# Patient Record
Sex: Male | Born: 1935 | Race: White | Hispanic: No | Smoking: Current every day smoker
Health system: Southern US, Community
[De-identification: ages and names within clinical notes are randomized; demographics above are authoritative.]

## PROBLEM LIST (undated history)

## (undated) DIAGNOSIS — J449 Chronic obstructive pulmonary disease, unspecified: Secondary | ICD-10-CM

## (undated) DIAGNOSIS — F32A Depression, unspecified: Secondary | ICD-10-CM

## (undated) DIAGNOSIS — G4700 Insomnia, unspecified: Secondary | ICD-10-CM

## (undated) DIAGNOSIS — E1140 Type 2 diabetes mellitus with diabetic neuropathy, unspecified: Secondary | ICD-10-CM

## (undated) DIAGNOSIS — R609 Edema, unspecified: Secondary | ICD-10-CM

## (undated) DIAGNOSIS — F329 Major depressive disorder, single episode, unspecified: Secondary | ICD-10-CM

## (undated) DIAGNOSIS — E785 Hyperlipidemia, unspecified: Secondary | ICD-10-CM

## (undated) DIAGNOSIS — I10 Essential (primary) hypertension: Secondary | ICD-10-CM

## (undated) HISTORY — PX: ANKLE SURGERY: SHX546

## (undated) HISTORY — DX: Hyperlipidemia, unspecified: E78.5

## (undated) HISTORY — PX: VASECTOMY: SHX75

## (undated) HISTORY — DX: Major depressive disorder, single episode, unspecified: F32.9

## (undated) HISTORY — DX: Edema, unspecified: R60.9

## (undated) HISTORY — DX: Chronic obstructive pulmonary disease, unspecified: J44.9

## (undated) HISTORY — DX: Depression, unspecified: F32.A

## (undated) HISTORY — DX: Insomnia, unspecified: G47.00

## (undated) HISTORY — DX: Type 2 diabetes mellitus with diabetic neuropathy, unspecified: E11.40

---

## 2002-10-13 ENCOUNTER — Encounter: Payer: Self-pay | Admitting: Family Medicine

## 2002-10-13 ENCOUNTER — Encounter: Admission: RE | Admit: 2002-10-13 | Discharge: 2002-10-13 | Payer: Self-pay | Admitting: Family Medicine

## 2003-06-07 ENCOUNTER — Encounter: Admission: RE | Admit: 2003-06-07 | Discharge: 2003-06-07 | Payer: Self-pay | Admitting: Family Medicine

## 2003-06-14 ENCOUNTER — Encounter: Admission: RE | Admit: 2003-06-14 | Discharge: 2003-06-14 | Payer: Self-pay | Admitting: Family Medicine

## 2006-04-12 ENCOUNTER — Inpatient Hospital Stay (HOSPITAL_COMMUNITY): Admission: EM | Admit: 2006-04-12 | Discharge: 2006-04-25 | Payer: Self-pay | Admitting: Emergency Medicine

## 2006-04-16 ENCOUNTER — Encounter (INDEPENDENT_AMBULATORY_CARE_PROVIDER_SITE_OTHER): Payer: Self-pay | Admitting: Interventional Cardiology

## 2006-11-13 ENCOUNTER — Inpatient Hospital Stay (HOSPITAL_COMMUNITY): Admission: EM | Admit: 2006-11-13 | Discharge: 2006-11-16 | Payer: Self-pay | Admitting: Emergency Medicine

## 2006-11-26 ENCOUNTER — Emergency Department (HOSPITAL_COMMUNITY): Admission: EM | Admit: 2006-11-26 | Discharge: 2006-11-26 | Payer: Self-pay | Admitting: Emergency Medicine

## 2007-09-08 ENCOUNTER — Emergency Department (HOSPITAL_COMMUNITY): Admission: EM | Admit: 2007-09-08 | Discharge: 2007-09-08 | Payer: Self-pay | Admitting: Emergency Medicine

## 2008-03-03 IMAGING — CR DG CHEST 1V PORT
2 series · 2 of 2 positions shown · non-contrast
Comparison: 04/13/06.

CLINICAL DATA: Altered mental status.  Intoxicated. Fall.
 PORTABLE CHEST - 1 VIEW:

[view not recorded (1 of 2)]
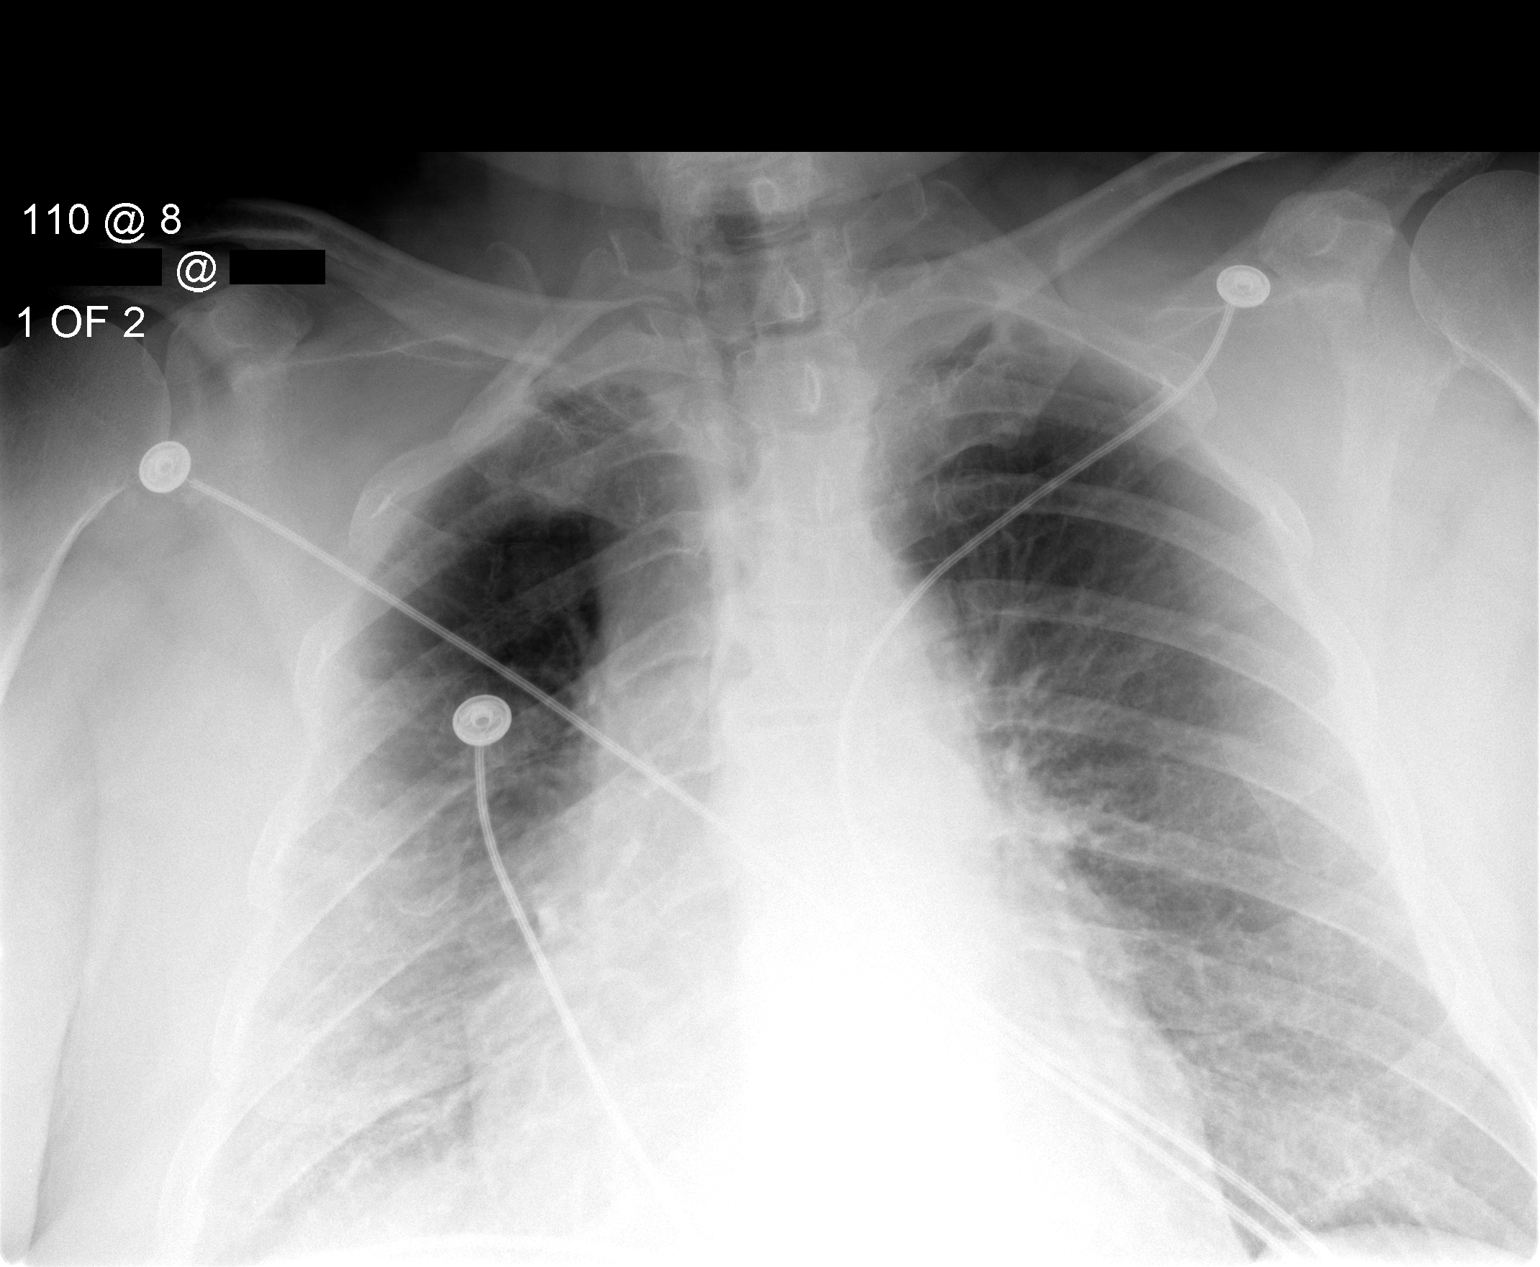

[view not recorded (2 of 2)]
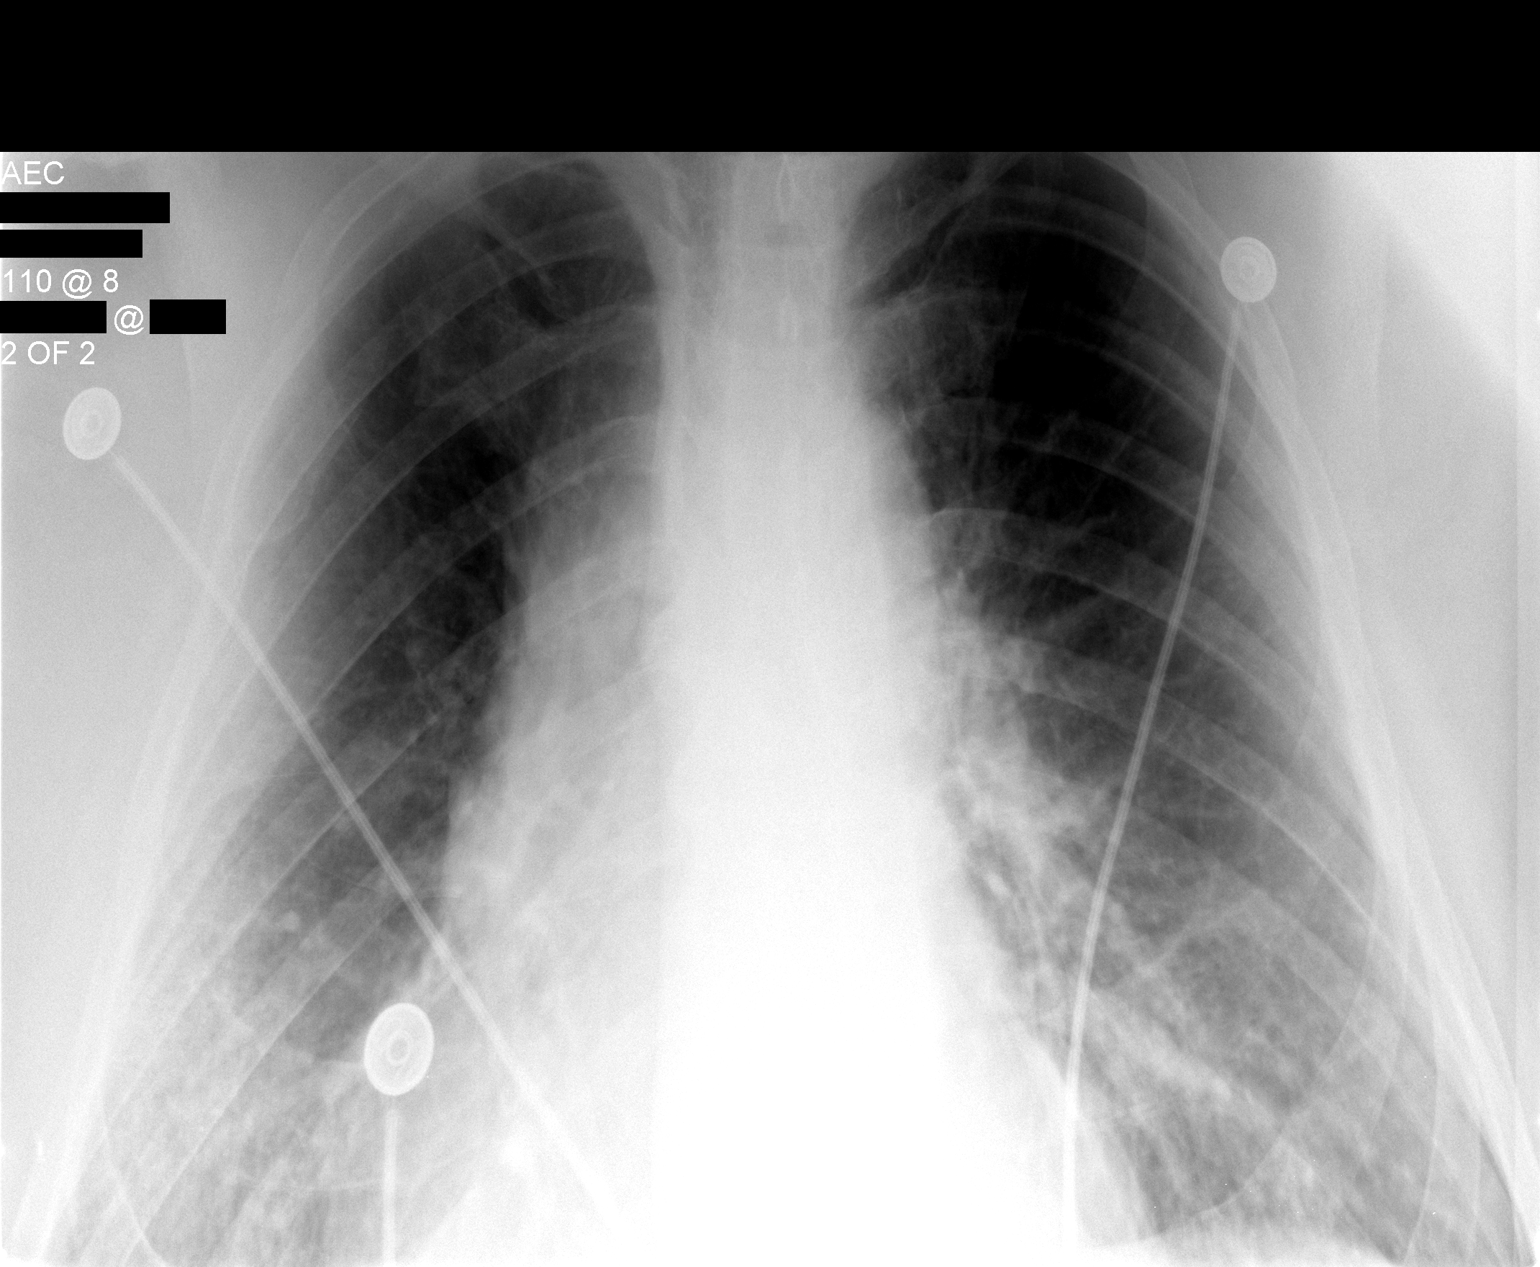

[2 of 2 positions shown; findings below may reference images not displayed]

FINDINGS: Both lungs are grossly clear. Heart size is within normal limits allowing for portable technique and patient positioning.
IMPRESSION: No acute findings.

## 2008-12-05 ENCOUNTER — Encounter: Payer: Self-pay | Admitting: Cardiology

## 2008-12-06 ENCOUNTER — Encounter: Payer: Self-pay | Admitting: Cardiology

## 2008-12-27 ENCOUNTER — Encounter: Payer: Self-pay | Admitting: Cardiology

## 2009-01-09 ENCOUNTER — Ambulatory Visit: Payer: Self-pay | Admitting: Cardiology

## 2009-01-09 DIAGNOSIS — G609 Hereditary and idiopathic neuropathy, unspecified: Secondary | ICD-10-CM | POA: Insufficient documentation

## 2009-01-09 DIAGNOSIS — F17200 Nicotine dependence, unspecified, uncomplicated: Secondary | ICD-10-CM

## 2009-01-09 DIAGNOSIS — E119 Type 2 diabetes mellitus without complications: Secondary | ICD-10-CM

## 2009-01-09 DIAGNOSIS — I10 Essential (primary) hypertension: Secondary | ICD-10-CM | POA: Insufficient documentation

## 2009-01-09 DIAGNOSIS — I499 Cardiac arrhythmia, unspecified: Secondary | ICD-10-CM | POA: Insufficient documentation

## 2009-01-09 DIAGNOSIS — R609 Edema, unspecified: Secondary | ICD-10-CM | POA: Insufficient documentation

## 2009-01-09 DIAGNOSIS — I4891 Unspecified atrial fibrillation: Secondary | ICD-10-CM

## 2009-01-09 DIAGNOSIS — E785 Hyperlipidemia, unspecified: Secondary | ICD-10-CM

## 2009-01-13 ENCOUNTER — Encounter: Payer: Self-pay | Admitting: Physician Assistant

## 2009-01-17 ENCOUNTER — Encounter: Payer: Self-pay | Admitting: Cardiology

## 2009-03-15 ENCOUNTER — Ambulatory Visit: Payer: Self-pay | Admitting: Cardiology

## 2009-03-15 DIAGNOSIS — R05 Cough: Secondary | ICD-10-CM

## 2009-03-23 ENCOUNTER — Telehealth: Payer: Self-pay | Admitting: Cardiology

## 2010-03-27 NOTE — Progress Notes (Signed)
Summary: PRADAXA & ECHO F/U  ---- Converted from flag ---- ---- 03/15/2009 5:11 PM, Rollene Rotunda, MD, [facility] wrote: Please call him in one week and see if he called about the Pradaxa.  When you talk to him, tell him that you never got a copy of any old echoes and that he needs an echocardiogram.  (I don't see that we have one unless I am just missing it.) ------------------------------  Phone Note Outgoing Call   Call placed by: Carlye Grippe,  March 23, 2009 8:43 AM Call placed to: Patient Summary of Call: Nurse called and left message on voicemail to check on status of the above. Spoke with patient and he said that he did speak with [employer] and they will pay for pradaxa. Patient also informed that he needed an echo and he said that he has to have some repairs on his vessels in his eyes per Dr. Haynes(optomologist) on Monday, and after all this is done he would call us back on getting the echo and start of pradaxa.  Initial call taken by: Carlye Grippe,  March 23, 2009 8:44 AM

## 2010-03-27 NOTE — Assessment & Plan Note (Signed)
Summary: 1 MO FU -SRS   Visit Type:  Follow-up Primary Provider:  Ernestine Conrad, MD  CC:  Atrial Fib.  History of Present Illness: The patient presents for follow up of the above.  He has been feeling poorly over several days with URI symptoms of cough congestion and watery eyes.  He was treated with a Zpack but is no longer better.  He is quite upset about how he feels.  He has no fever today but says that 98 degrees is "high for me".   When I last saw him, as a new patient, we talked about his atrial fibrillation.  This was apparently a chronic diagnosis. However, because of alcohol use and some issues with compliance he was apparently not taking Coumadin. I did have him wear a Holter monitor which demonstrated persistent atrial fibrillation. On this he did have some 2 second pauses but nothing longer. He would have bursts of a heart rate going as high as 190 but the average was about 98.  (Of note the patient reports that no one ever notified him of these results. We have documented in the records that he was called and verbalized understanding of these results.)  He did consent to starting Coumadin and he is here to talk about this today. Unfortunately it is clear talking to him that he is not willing to pay the co-pay to have his INRs followed. He understands clearly the risk of stroke.  He says he has not felt well enough in recent weeks to "smoke more than a few cigarettes a day". It's not clear to me how much he is drinking but he says very little as he just doesn't have an appetite for it. He is not describing any new chest pressure, neck or arm discomfort. He's not had any presyncope or syncope. His lower extremity swelling seems to be baseline.  Current Medications (verified): 1)  Protonix 40 Mg Tbec (Pantoprazole Sodium) .... Take 1 Tablet By Mouth Once A Day 2)  Januvia 100 Mg Tabs (Sitagliptin Phosphate) .... Take 1 Tablet By Mouth Once A Day 3)  Ventolin Hfa 108 (90 Base) Mcg/act Aers  (Albuterol Sulfate) .... As Needed 4)  Advair Diskus 250-50 Mcg/dose Aepb (Fluticasone-Salmeterol) .... Inhale 1 Puff Two Times A Day 5)  Lipitor 20 Mg Tabs (Atorvastatin Calcium) .... Take One Tablet By Mouth At Bedtime 6)  Zolpidem Tartrate 10 Mg Tabs (Zolpidem Tartrate) .... Take 1 Tab By Mouth At Bedtime 7)  Tricor 145 Mg Tabs (Fenofibrate) .... Take 1 Tablet By Mouth Once A Day 8)  Diltiazem Hcl Er Beads 240 Mg Xr24h-Cap (Diltiazem Hcl Er Beads) .... Take One Capsule By Mouth Daily 9)  Aspirin 81 Mg Tbec (Aspirin) .... Take One Tablet By Mouth Two Times A Day 10)  Furosemide 40 Mg Tabs (Furosemide) .... Take One Tablet By Mouth Two Times A Day As Needed Swelling 11)  Potassium Chloride Crys Cr 20 Meq Cr-Tabs (Potassium Chloride Crys Cr) .... Take 1 Tablet By Mouth As Needed With Lasix 12)  Flonase 50 Mcg/act Susp (Fluticasone Propionate) .... One Spray in Each Nostril Once Daily 13)  Humalog Pen 100 Unit/ml Soln (Insulin Lispro (Human)) .... Inject 10 Units Subcutaneously With Meals. 14)  Lantus 100 Unit/ml Soln (Insulin Glargine) .... Inject 12 Units Subcutaneously At Bedtime 15)  Coq10 200 Mg Caps (Coenzyme Q10) .... Take 1 Capsule By Mouth Once A Day or Every Other Day 16)  Centrum Silver  Tabs (Multiple Vitamins-Minerals) .... Take 1 Tablet  By Mouth Once A Day 17)  Megared Omega-3 Krill Oil .... Take 1 Tablet By Mouth Every Other Day  Allergies (verified): 1)  Neurontin  Comments:  Nurse/Medical Assistant: The patient's medications were reviewed with the patient and were updated in the Medication List. Pt brought a list of medications to office visit. Cyril Loosen, RN, BSN (March 15, 2009 4:10 PM)  Past History:  Past Medical History: Last updated: 01/09/2009 Diabetes Mellitus II since 2005 Edema Insomnia Hyperlipidemia HTN Depression Diabetic Neuropathy COPD  Review of Systems       As stated in the HPI and negative for all other systems.   Vital  Signs:  Patient profile:   75 year old male Height:      76 inches Weight:      280.75 pounds Temp:     98.3 degrees F oral Pulse rate:   84 / minute BP sitting:   122 / 83  (left arm) Cuff size:   regular  Vitals Entered By: Cyril Loosen, RN, BSN (March 15, 2009 4:01 PM) CC: Atrial Fib Comments Pt states he is sick and has some kind of virus. He states he saw primary MD on Monday was was told he was dehydrated. He was given Z-pack. He states one minute he's freezing and one he's burning up. He states burning up right now. No fever.    Physical Exam  General:  Well developed, well nourished, in no acute distress. Head:  normocephalic and atraumatic Eyes:  PERRLA/EOM intact; conjunctiva and lids normal. Mouth:  Upper dentures, lower partial, gums and palate normal. Oral mucosa normal. Neck:  Neck supple, no JVD. No masses, thyromegaly or abnormal cervical nodes. Chest Wall:  no deformities or breast masses noted Lungs:  Clear bilaterally to auscultation and percussion. Abdomen:  Bowel sounds positive; abdomen soft and non-tender without masses, organomegaly, or hernias noted. No hepatosplenomegaly, morbidly obese with umbilical hernia Msk:  Back normal, normal gait. Muscle strength and tone normal. Extremities:  moderate bilateral lower extremity edema right greater than left with chronic venous stasis changes Neurologic:  Alert and oriented x 3. Skin:  Intact without lesions or rashes. Cervical Nodes:  no significant adenopathy Psych:  agitated.     Detailed Cardiovascular Exam  Neck    Carotids: Carotids full and equal bilaterally without bruits.      Neck Veins: Normal, no JVD.    Heart    Inspection: no deformities or lifts noted.      Palpation: normal PMI with no thrills palpable.      Auscultation: irregular rate and rhythm, S1, S2 without murmurs, rubs, gallops, or clicks.    Vascular    Abdominal Aorta: no palpable masses, pulsations, or audible bruits.       Femoral Pulses: normal femoral pulses bilaterally.      Pedal Pulses: diminished right dorsalis pedis pulse, diminished right posterior tibial pulse, diminished left dorsalis pedis pulse, and diminished left posterior tibial pulse.      Radial Pulses: normal radial pulses bilaterally.      Peripheral Circulation: mild chronic venous stasis changes   EKG  Procedure date:  03/15/2009  Findings:      atrial fibrillation, rate 94, left axis deviation, no acute ST-T wave changes.  Impression & Recommendations:  Problem # 1:  ATRIAL FIBRILLATION (ICD-427.31) We have had long discussions about this. At this point he will not consent to Coumadin though he knows the risk of stroke without therapeutic anticoagulation.   I gave him  information on Pradaxa and he has the ability to call his insurance company to see if this drug would be covered at a rate he can afford. If not he will call the company to see if he would qualify for any medication assistance programs. We will then check back with him to see if he has done any of this and the results. We will then discuss options. I discussed with him taking a higher dose aspirin but he does not want to do this because of previous ulcers apparently. Orders: EKG w/ Interpretation (93000)  Problem # 2:  COUGH (ICD-786.2) The patient has had a recent cough and treatment with antibiotics. However, he is not improved. I spoke with his primary care physician today. They have agreed to see the patient tomorrow morning at 8 AM to consider further therapies. At this point I do not think that he is volume overloaded but a chest x-ray would be reasonable. A BNP if not drawn previously would be helpful. I did tell the patient that if he feels ill and off I could help him get to the emergency room but he did not want to do this.  Problem # 3:  EDEMA (ICD-782.3) I was unable to obtain results of any previous echocardiogram. When we speak with him in one week I will ask  him to get an echocardiogram if he is feeling well enough to comply with this exam. For now we will continue the meds as listed.  Problem # 4:  ENCOUNTER FOR LONG-TERM USE OF OTHER MEDICATIONS (ICD-V58.69) This patient has multiple medical problems. He was somewhat angry today about various issues such as bills that he has had a in doctor's offices. He was not pleasant with my staff or with me. We discussed this. I am afraid that the noncompliance might be a significant issue with this gentleman as most options I have offered him and met with resistance for one reason or another. We will continue to try to work through this.  Patient Instructions: 1)  You have an appointment on 03/16/09 @8 :00am with Dr. Loney Hering. 2)  You have been given information on pradaxa. This is the medication [place] can take inplace of coumadin. Please check with your insurance  company to see if they cover this drug. 3)  No follow up appointment needed. 4)  Your physician recommends that you continue on your current medications as directed. Please refer to the Current Medication list given to you today.

## 2010-07-10 NOTE — H&P (Signed)
NAME:  Luis Mccall, SELF NO.:  [id number]   MEDICAL RECORD NO.:  [id number]          PATIENT TYPE:  EMS   LOCATION:  ED                           FAC[facility]   PHYSICIAN:  Herbie Saxon, MDDATE OF BIRTH:  13-Nov-1935   DATE OF ADMISSION:  11/13/2006  DATE OF DISCHARGE:                              HISTORY & PHYSICAL   PRIMARY CARE PHYSICIAN:  Not known.   PRESENTING COMPLAINT:  Confusion, fall of one day duration.   HISTORY OF PRESENT ILLNESS:  This is a 75 year old Caucasian male who  was brought by EMS after having an alcoholic binge. He had been drinking  vodka all day at a bar after which he sustained a fall resulting in  scalp laceration and swelling.  The patient was extremely disoriented,  combative and non cooperative with giving any detailed history.  No  family member is available at present to add on to his events.  Emergency room chart and EMS notes are reviewed.   PAST MEDICAL HISTORY:  1. Diabetes.  2. Hypertension.  3. Hyperlipidemia.   PAST SURGICAL HISTORY:  Vasectomy.   FAMILY HISTORY:  Mother has lung cancer.  Father had coronary artery  disease.   SOCIAL HISTORY:  He is a known alcoholic, heavy alcohol abuser.   ALLERGIES:  No known drug allergies.   MEDICATIONS:  1. Lantus insulin.  2. Lisinopril, dose not known.   REVIEW OF SYSTEMS:  Not available as the patient is too disoriented to  give any detailed history.   PHYSICAL EXAMINATION:  VITAL SIGNS:  Temperature 98.  Pulse 98.  Respiratory rate is 18.  Blood pressure 104/61.  GENERAL APPEARANCE:  He is not in acute respiratory distress.  Patient  has dysarthria, is disoriented, lethargic and somnolent.  HEENT:  Pupils equal, round, reactive to light and accommodation. Head  has a 4 cm scalp laceration and hematoma on the right side of the scalp.  No jaundice.  No cyanosis or clubbing.  NECK:  Supple.  There is no elevated jugular venous distention or  carotid bruit.   No thyromegaly.  CHEST:  Is clinically clear.  HEART:  Sounds 1 and 2, regular rate and rhythm.  ABDOMEN:  Soft, nontender.  There is truncal obesity.  No organomegaly.  __________ are intact.  Bowel sounds are normoactive.  EXTREMITIES:  He has a right ankle bruise, left knee bruise.  Peripheral  pulses present.  No pedal edema.  There is no joint swelling.  NEUROLOGICAL:  He is arousable but incoherent.  Power is 5 in all limbs.  Cranial nerves and sensory system could not be tested because of the  patient's mental state.   LABORATORY DATA:  On available labs the white blood cell count is 12,  hematocrit 39, platelet count 282,000.  Chemistry shows a glucose of  257.  Sodium 126, potassium 4.1, chloride 92, bicarbonate 23, BUN 14,  creatinine 0.9.  CT scan of brain negative.  CT scan of cervical spine  negative.  Other labs, x-rays and EKG are pending.   IMPRESSION:  1. Altered mental status secondary to  alcohol intoxication.  2. Severe hyponatremia.  3. Uncontrolled diabetes.  4. Scalp laceration.  5. Morbid obesity.  6. History of hyperlipidemia.   PLAN:  The patient is to be admitted to a telemetry bed, have IV fluids,  normal saline hydration at 100 cc per hour.  Scalp is to be sutured.  Tetanus toxoid coverage.  Will continue his lisinopril and start him on  Lantus insulin 25 units subcutaneously q.h.s.  Will cover the scalp  wound for infection with Rocephin 1 gram IV daily.  Will repeat the  chest x-ray, electrocardiogram, urinalysis, coagulation parameters.  Start him on IV Ativan detox protocol.  Watch for delirium tremens.  Fall, aspiration and seizure precautions.  Thiamine will be added to the  IV fluids.  Lovenox 40 mg subcutaneously q.24h.  Phenergan 12.5 mg IV  q.8h. p.r.n., Reglan 10 mg IV q.6h. p.r.n. for nausea.  Tylenol 650 mg  q.4h. p.r.n. mild to moderate pain.  Will check stool Hemoccult's.  Check his complete blood count and total chemistry in the  morning.  Patient would benefit from alcohol detox and possible behavioral health  input when he is medically stable.  For now he is a full code and this  will be clarified by patient when he is more alert.      Herbie Saxon, MD  Electronically Signed     MIO/MEDQ  D:  11/13/2006  T:  11/14/2006  Job:  [id number]

## 2010-07-10 NOTE — Discharge Summary (Signed)
NAME:  Luis Mccall, Luis Mccall NO.:  [id number]   MEDICAL RECORD NO.:  [id number]          PATIENT TYPE:  INP   LOCATION:  1429                         FACILITY:  [facility]   PHYSICIAN:  Michaelyn Barter, M.D. DATE OF BIRTH:  [birth year]   DATE OF ADMISSION:  11/13/2006  DATE OF DISCHARGE:  11/16/2006                               DISCHARGE SUMMARY   PRIMARY CARE PHYSICIAN:  Unassigned.   FINAL DIAGNOSES:  1. Acute alcohol intoxication.  2. Falling.  3. Right-sided parietal scalp laceration.  4. Hyponatremia.  5. Ongoing alcohol abuse.  6. Hypo-albumin.  7. Uncontrolled diabetes mellitus.   PROCEDURES:  1. CT scan of the head without contrast, completed November 13, 2006.  2. CT scan of the spine without contrast November 13, 2006.  3. Portable chest x-ray November 13, 2006.   HISTORY OF PRESENT ILLNESS:  Mr. Luis Mccall is a 75 year old gentleman who  indicated that he does have a history of alcoholism.  He states that for  two or three weeks prior to the date of admission, he had not consumed  any alcohol; however, on the day of admission he had drank as many as 12  glasses of vodka and tonic.  He had been at a bar, and some time after  consuming all of the alcohol, he fell and sustained a laceration to the  right side of his scalp.  He was very disoriented and subsequently was  brought to the hospital for further evaluation.   PAST MEDICAL HISTORY:  Please see that dictated by Dr. Jonna Munro.   HOSPITAL COURSE:  1. Falling.  The etiology of the patient's fall may have been      imbalance caused by the patient being acutely intoxicated with      alcohol.  A CT scan of the patient's head was completed on      September 18th.  It revealed no acute intracranial abnormalities, a      large soft-tissue hematoma and laceration involving the right      parietal scalp were seen, no underlying fracture.  Sinus disease      involving the maxillary sinuses was also  noted.  The patient also      had a CT scan of his spine completed at the cervical region.  Multi-      level degenerative changes of the cervical spine without acute bone      abnormalities were noted.  The patient did not demonstrate any      other symptoms regarding his ability to ambulate over the course of      his hospitalization.  2. Acute alcohol intoxication.  Again, the patient indicated that he      had essentially been binge drinking on the date of his admission      into the hospital.  His alcohol level at the time of admission was      89.  He was started on the Librium alcohol withdrawal protocol.  He      did not demonstrate any obvious signs of withdrawal during his      hospital  course.  He was provided with thiamine, multivitamin and      folic acid.  3. Hyponatremia.  The patient's sodium level was 126 at the time of      admission.  Whether or not this played a role with regards to his      falling is questionable.  Normal 0.9 normal saline was provided to      the patient.  There was an improvement of the patient's sodium;      however, it did not completely normalize.  At the time of      discharge, his sodium was 130.  4. Hypo-albumin.  The patient's albumin level was noted to be 2.9.      This may or may not have been associated with the patient's protein      intake.  This may need to be monitored, once the patient is      discharged from the hospital.  5. Diabetes mellitus.  The patient's sugars were not ideally      controlled.  He was provided with Lantus insulin, as well as      sliding scale coverage.  He could not recall his current regimen of      diabetic medications that he takes at home.  He may be able to      follow up with his primary care doctor for further evaluation, once      he is discharged from the hospital.   CONDITION AT THE TIME OF DISCHARGE:  Currently, the patient indicates  that he feels fine, and he has requested to be discharged  home.   VITAL SIGNS:  His temperature is 97.9, heart rate 102, respirations 20,  blood pressure 135/106.  His O2 sat is 97% on room air.  His CBG is 177.   The decision has been made to discharge the patient from the hospital.  He will be discharged home on  1)  Lantus insulin 25 units q.h.s., 2)  Lisinopril 5 mg, one tablet p.o. q. daily.   The patient has been counseled regarding his need to stop drinking  alcohol; however, the patient indicates that he has no interest in  stopping his alcohol consumption.  He stated that his brother, his  father, and numerous other family members are alcoholics.  He went on to  state that it appears to be in his genes, and given the fact that he is  almost 75 years of age, he has no plans to stop drinking alcohol.      Michaelyn Barter, M.D.  Electronically Signed     OR/MEDQ  D:  11/16/2006  T:  11/16/2006  Job:  [id number]
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
## 2010-07-13 NOTE — H&P (Signed)
NAME:  Luis Mccall, Luis Mccall NO.:  [id number]   MEDICAL RECORD NO.:  [id number]          PATIENT TYPE:  INP   LOCATION:  0164                         FACILITY:  [facility]   PHYSICIAN:  Kela Millin, M.D.DATE OF BIRTH:  Jan 22, 1936   DATE OF ADMISSION:  04/12/2006  DATE OF DISCHARGE:                              HISTORY & PHYSICAL   PRIMARY CARE PHYSICIAN:  Dr. Tally Joe.   CHIEF COMPLAINT:  Status post fall with ankle pain/injury.   HISTORY OF PRESENT ILLNESS:  The patient is an obese 75 year old white  male with past medical history significant for heavy alcohol use,  history of seizures ? alcohol-related, no anticonvulsants, diabetes,  hypertension, hyperlipidemia, COPD, who presents with the above  complaints.  The patient states that he had a seizure and fell, and it  was then that he injured his ankle.  He states that he drinks about a  fifth of alcohol a day, he lives out of a hotel, he has had seizures  intermittently in the past couple of years.  He admits to dyspnea on  exertion, denies chest pain, orthopnea, PND, cough, fevers, dysuria,  melena, hematochezia and no diarrhea.   The patient was seen in the ER and an x-ray of his ankle revealed  fracture dislocation of ankle and disruption of the ankle mortise.  A  chest x-ray was done and it was negative for acute findings.  His O2  sats on room air 94%.  The patient's sodium was noted to be low at 127,  orthopedics was consulted and they requested admission to the [facility] because of the patient's medical problems.   PAST MEDICAL HISTORY:  As stated above.   MEDICATIONS:  1. Lisinopril 10 mg daily.  2. HCTZ mg daily.  3. Lipitor 20 mg daily.  4. Januvia 100 mg daily.  5. Cyclobenzaprine 10 mg, 1/2 tablet t.i.d.  6. Elavil 75 mg q.h.s.  7. Ambien CR 12.5 q.h.s. p.r.n.  8. Albuterol MDI p.r.n.  9. TriCor 145 mg daily.   ALLERGIES:  NKDA.   SOCIAL HISTORY:  He smokes a pack per  day times about 50 years, heavy  alcohol use since about age 27 and currently about a fifth a day.   FAMILY HISTORY:  His mother is deceased, had lung cancer.  His Dad had  coronary artery disease.   REVIEW OF SYSTEMS:  As per HPI, other review of systems negative.   PHYSICAL EXAM:  GENERAL:  The patient is an obese elderly white male, he  is alert and oriented x3, in no respiratory distress.  VITAL SIGNS:  His temperature is 98.2, blood pressure 128/73, pulse of  90, respiratory rate of 16, O2 sat 94%.  HEENT:  PERRL, EOMI, slightly dry mucous membranes, no oral exudates.  NECK:  Supple, no adenopathy, no JVD appreciated, no thyromegaly.  LUNGS:  Moderate air movement, no crackles and no wheezes.  CARDIOVASCULAR:  Regular rate and rhythm, normal S1, S2.  ABDOMEN:  Soft, obese, bowel sounds present, nontender, nondistended, no  organomegaly and no masses palpable.  EXTREMITIES:  His right  lower leg is in a cast, no edema and no cyanosis  in the left leg.  NEURO:  He is alert and oriented x3, cranial nerves II-XII grossly  intact, nonfocal exam.   LABORATORY DATA:  His white cell count is 9.4, hemoglobin 13.6,  hematocrit 39.3, platelet count is 281,000, neutrophil count 77% and his  INR is 1.1.  His alcohol level is 9 and his sodium is 127, potassium  3.7, chloride 94, CO2 is 26, glucose 123, BUN 11, creatinine 1.04,  calcium 8.5, total protein 6.1, AST is 38.  His chest x-ray is as stated  above and has an ankle x-ray also as stated above.   ASSESSMENT AND PLAN:  1. Seizures - from hyponatremia versus alcohol-related.  Will hydrate      with normal saline, Ativan detox protocol and follow.  2. Hyponatremia - likely secondary to hydrochlorothiazide, will obtain      a TSH, cortisol level, also urine electrolytes.  Noted patient's      history of alcohol abuse, follow above studies.  3. Diabetes mellitus - monitor Accu-Cheks, sliding scale insulin,      follow and resume Januvia  when tolerating by mouth.  4. Chronic obstructive pulmonary disease - clinically stable,      oxygenating well on room air, will place on bronchodilators and      follow.  5. Hypertension - continue lisinopril, hold hydrochlorothiazide as      above.  6. Right ankle fracture - per Orthopedics.  7. Question homelessness - will follow and consult Care      Management/Social Services for assistance.  8. Tobacco abuse - Smoking cessation consult.      Kela Millin, M.D.  Electronically Signed     ACV/MEDQ  D:  04/13/2006  T:  04/13/2006  Job:  [id number]   cc:   Tally Joe, M.D.  Fax: [phone]

## 2010-07-13 NOTE — Discharge Summary (Signed)
NAME:  LENY, MOROZOV NO.:  [id number]   MEDICAL RECORD NO.:  [id number]          PATIENT TYPE:  INP   LOCATION:  1403                         FACILITY:  [facility]   PHYSICIAN:  Hollice Espy, M.D.DATE OF BIRTH:  1935-03-28   DATE OF ADMISSION:  04/12/2006  DATE OF DISCHARGE:                               DISCHARGE SUMMARY   ANTICIPATED DATE OF DISCHARGE:  April 21, 2006.   CONSULTANTS:  Dr. Katrinka Blazing of [facility] and Dr. Thomasena Edis with  orthopedic surgery. The patient's PCP is  Dr. Tally Joe.   DISCHARGE DIAGNOSES:  1. Right ankle fracture dislocation.  2. Alcohol abuse ongoing.  3. Seizure disorder.  4. New onset atrial fibrillation likely from heavy alcohol abuse.  5. Hypotension now resolved.  6. Diabetes mellitus poorly controlled.   DISCHARGE LOCATION:  The patient will be discharged to a skilled nursing  facility.   DISCHARGE DIET:  Heart healthy, carb-modified diet.   DISPOSITION:  From initial presentation improved.   ACTIVITY:  Will be as per PT/OT at the skilled nursing facility.   DISCHARGE MEDICATIONS:  1. Protonix 40 p.o. daily.  2. Tricor 145 p.o. daily.  3. Flexeril 5 p.o. t.i.d.  4. Lisinopril 10 p.o. daily.  5. Advair 250/50 one puff b.i.d.  6. Multivitamin p.o. daily.  7. Thiamine 100 mg p.o. daily.  8. Lexapro 10 p.o. daily.  9. Lyrica 50 p.o. t.i.d.  10.Nicotine patch topically daily.  11.Guaifenesin 1200 mg p.o. b.i.d. p.r.n.  12.Lipitor 20 p.o. q.h.s.  13.Senokot three tabs p.o. q.h.s. p.r.n.  14.Cardizem 360 p.o. daily.  15.Imdur 30 p.o. daily.  16.Percocet 1-2 tabs 5/325 p.o. q.4 h. p.r.n.  17.Xanax 0.25 p.o. b.i.d.   HOSPITAL COURSE:  The patient is a 75 year old white male with past  medical history as above who continues to abuse alcohol heavily who came  into the emergency room complaining of right ankle pain. He was found to  have by x-ray an ankle fracture/dislocation. He has a history of heavy  alcohol use and a previous history of seizures and presented with what  sounded like a possible seizure earlier on in the day. He was also found  to have a sodium 127. With these findings it was felt best to come to  the medicine service for orthopedic surgery consult to consult the  patient and recommended an ORIF for the patient which was done on day of  admission. The patient tolerated the procedure well. Postop because of  history of alcohol abuse and seizures, it was felt best that he be put  on systemic anticoagulation. Over the next several days the patient  remained stable. On postop day #1 the patient had a brief episode of  unresponsiveness, calling the rapid response team. However, after  evaluation they felt that he was fully alert and oriented after. He was  kept in the stepdown unit overnight, but then, otherwise, did well. He  had no episodes of any type of seizures. By February 18 the patient was  continuing to do well. PT/OT evaluated the patient and recommended  skilled nursing. It appeared with  his history of alcohol abuse as well  as depression and mood disorder he would be reevaluated. Psychiatry saw  the patient. It was Dr. Providence Crosby recommendation that his Flexeril be  increased to 10 mg p.o. daily. The patient was not amenable for  involuntary commitment, but he was recommended to go to alcohol rehab  which he says that he will do an outpatient voluntarily after his ankle  has healed. In the meantime the patient was doing well. However, on  April 15, 2006 he was found to be new onset atrial fibrillation. Labs  were checked including a 2D echo. He was started on Cardizem. He was not  on anticoagulation again given his history of alcohol abuse and high  risk for falls. His echo was unremarkable, although showing an enlarged  __________ right atrium. Cardiology was consulted. Saw the patient on  February 21 and was in agreement that the patient was not a  candidate  for anticoagulation, but did recommend continued plans for rate control.  They also recommended chronic to routine benzodiazepine __________ which  will also aid in keeping his heart rate down plus they did not recommend  aspirin at this time. The patient from a medical standpoint is felt to  be medically stable as of April 18, 2006.   PLAN:  Will be for him to go to a skilled nursing facility when a bed is  available.      Hollice Espy, M.D.  Electronically Signed     SKK/MEDQ  D:  04/18/2006  T:  04/18/2006  Job:  [id number]   cc:   Thomasena Edis, M.D.   Antonietta Breach, M.D.   Tally Joe, M.D.  Fax: [phone]   Lyn Records, M.D.  Fax: [phone]

## 2010-07-13 NOTE — Consult Note (Signed)
NAME:  Luis Mccall, Luis Mccall NO.:  [id number]   MEDICAL RECORD NO.:  [id number]          PATIENT TYPE:  EMS   LOCATION:  ED                           FACILITY:  [facility]   PHYSICIAN:  Erasmo Leventhal, M.D.DATE OF BIRTH:  07-Jan-1936   DATE OF CONSULTATION:  04/12/2006  DATE OF DISCHARGE:                                 CONSULTATION   TIME:  4:15 p.m.   HISTORY OF PRESENT ILLNESS:  Luis Mccall is a 75 year old Caucasian male  who reports that he fell this morning and sat down on his right ankle,  and has sustained a fracture/dislocation.  Patient reports that he does  have a history of seizure disorder, and he thought he may have had a  seizure today.  He presented to [facility] emergency room via EMS.  There is a diagnosis of having a right ankle fracture/dislocation.  He  was seen by Dr. Adriana Simas.  Dr. Melvyn Novas IV, orthopedics, was in the emergency  room.  He noticed the patient with severe pain with dislocation.  He  performed an intra-articular block and a closed reduction application of  splints, and patient felt much better.   Dr. Adriana Simas asked me to evaluate the patient.  Due to the patient having  multiple medical problems and associated comorbidities, it was  recommended that he be admitted to [facility] hospitalist's, Dr. Suanne Marker, for  further medical management.  I will proceed with orthopedic care.   ALLERGIES:  No known drug allergies.   CURRENT MEDICATIONS:  Amitriptyline, Ambien, Tricor, albuterol inhaler,  hydrochlorothiazide, Actos, Lexapro, lisinopril, Lipitor, Jenuvia, and  Lyrica.   Medical history includes type 2 diabetes, hypertension, dyslipidemia,  ethanol abuse.  There is also, by the patient's history, reported  seizure disorder.   PAST SURGICAL HISTORY:  Vasectomy.   FAMILY HISTORY:  Noncontributory.   SOCIAL HISTORY:  He lives alone in a hotel in [place] called [address].  He does not work.  He smokes one pack per day.  Alcohol is  one  fifth of liquor per day.   REVIEW OF SYSTEMS:  Please see dictated note from Dr. Suanne Marker.   PHYSICAL EXAMINATION:  GENERAL:  Awake and alert.  Oriented to person,  place, time, and circumstance.  Somewhat slurred speech.  Smelling  slightly of alcohol.  EXTREMITIES:  Bilateral upper extremities unremarkable.  Left lower  extremity is unremarkable.  Right lower extremity:  Abrasions, medial.  No open skin wounds.  Laterally displaced initially.  See Dr. Glenna Durand  note for the initial evaluation.  At this time, he had a normal  neuromotor examination pre and post reduction.   At this time, he is in a well-applied plaster splint.  He has good  capillary refill of the right lower extremity.   Plain x-rays reviewed before and after.  He has a right ankle  bimalleolar fracture/dislocation.  Post reduction x-rays looked  excellent.   IMPRESSION:  1. Right ankle bimalleolar fracture/dislocation.  Excellent reduction      and dislocation.  Unstable fracture pattern, needing internal      fixation.  2. Ethanol  abuse.  3. Hypotension.  4. Diabetes.  5. Questionable seizure disorder.  6. Also question about whether there may be a neuropathy with the      diabetes or not.   RECOMMENDATIONS:  At this time, he is comfortable.  His foot will be  elevated for edema reduction.  Dr. Suanne Marker is to admit the patient, and  then we will attend to him orthopedically.  He will undergone open  reduction/internal fixation of the right ankle.  He understands that the  inherent risks and benefits __________ but not limited to infection,  vascular damage, DVT, thromboembolism, skin breakdown, delayed union,  malunion, nonunion.  I think his comorbidities at this point in time  will play a significant factor in this where he will be able to follow  up postop instructions, etc.  A difficult situation but no perfect  answer; however, the fracture is unstable and will require internal  fixation for  definitive treatment.  I have discussed this with Dr.  Suanne Marker and currently awaiting for her to evaluate him.           ______________________________  Erasmo Leventhal, M.D.     RAC/MEDQ  D:  04/12/2006  T:  04/12/2006  Job:  [id number]   cc:   Kela Millin, M.D.   Tally Joe, M.D.  Fax: [phone]

## 2010-07-13 NOTE — Op Note (Signed)
NAME:  KEE, DRUDGE NO.:  [id number]   MEDICAL RECORD NO.:  [id number]          PATIENT TYPE:  INP   LOCATION:  0101                         FACILITY:  [facility]   PHYSICIAN:  Erasmo Leventhal, M.D.DATE OF BIRTH:  03-04-1935   DATE OF PROCEDURE:  04/12/2006  DATE OF DISCHARGE:                               OPERATIVE REPORT   PREOPERATIVE DIAGNOSIS:  Right ankle bimalleolar fracture dislocation.   POSTOPERATIVE DIAGNOSIS:  Right ankle bimalleolar fracture dislocation.   PROCEDURE:  Right ankle open reduction internal fixation.  Stress radiography with C-arm.   SURGEON:  Erasmo Leventhal, M.D.   ASSISTANT:  Jaquelyn Bitter. Chabon, P.A.   ANESTHESIA:  General.   ESTIMATED BLOOD LOSS:  Less than 50 mL.   DRAINS:  None.   COMPLICATIONS:  None.   TOURNIQUET TIME:  50 minutes at 300 mmHg.   DISPOSITION:  To PACU stable.   OPERATIVE DETAILS:  The patient was met in the emergency department and  counseled concerning his injuries.  I also discussed this with Dr.  Suanne Marker concerning his medical history and surgical clearance.  He was  cleared for surgery.   The patient was taken to the operating room and placed under general  anesthesia.  Preoperative prophylactic antibiotics were given.  The  right lower extremity was elevated, prepped with Duraprep and draped in  sterile fashion.  Then the Esmarch tourniquet was inflated to 300 mmHg.   A straight lateral incision made through the skin and subcutaneous  tissue, and the lateral border of the fibula.  A quite bit of hematoma  was encountered.  Subperiosteal dissection was undertaken along the  distal fibula.  It was opened; the area was irrigated as was the joint  reduced anatomically.  An intrafragmentary screw was placed from  anterior to posterior, then neutralized with a posterior applied 1/3  tubular plate.  C-arm confirmed excellent reduction of the fracture and  placement of implants.   Medial  side was opened through the skin incision on medial malleolus.  It was found to be comminuted; however, there was one large anterior  fragment.  It was opened, irrigated.  The joint was irrigated and  reduced anatomically and securely affixed with a 4-0 intrafragmentary  screw with a soft tissue washer.  At this point and time we had a well  reduced ankle.  There was no debris in the joint.  We had excellent  fixation medial and lateral.   On the medial side the wound was irrigated.  The saphenous vein and  nerve had been protected.  Subcutaneous was closed with Vicryl and skin  closed with nylon.  The lateral side irrigated.  Periosteum closed with  Vicryl, subcutaneous with Vicryl and skin closed with nylon.  As per the  anesthesia, 20 cc of 0.5% Marcaine was placed in the skin edges and the  joint for postoperative pain.  Sterile dressing was applied.  Tourniquet  was deflated and normal circulation of the foot and ankle at the end of  the case.   He tolerated the procedure well.  There were no complications  or  problems.  He was awakened and taken from the operating room to the  recovery room in stable condition.   HELP WITH SURGICAL TIME DECISION MAKING:  Mr. Jaquelyn Bitter. Chabon, P.A.  was needed throughout the entire case.           ______________________________  Erasmo Leventhal, M.D.     RAC/MEDQ  D:  04/12/2006  T:  04/12/2006  Job:  [id number]

## 2010-07-13 NOTE — Discharge Summary (Signed)
NAME:  Luis Mccall, Luis Mccall NO.:  [id number]   MEDICAL RECORD NO.:  [id number]          PATIENT TYPE:  INP   LOCATION:  1403                         FACILITY:  [facility]   PHYSICIAN:  Andres Shad. Rudean Curt, MD     DATE OF BIRTH:  [birth year]   DATE OF ADMISSION:  04/12/2006  DATE OF DISCHARGE:  04/25/2006                               DISCHARGE SUMMARY   ADDENDUM:  This addendum covers the period from February 22 through  April 25, 2006.  This is an addendum to the discharge summary  dictated on February 22 by Dr. Virginia Rochester.  The patient remained in  the hospital for approximately 1 week waiting placement.  There were no  medical issues during this time.  He did not have any complaints, and  his vital signs remained within acceptable limits.  He was discharged in  good condition to a skilled nursing facility on February 29.      Andres Shad. Rudean Curt, MD  Electronically Signed     PML/MEDQ  D:  07/31/2006  T:  07/31/2006  Job:  [id number]

## 2010-09-22 ENCOUNTER — Other Ambulatory Visit: Payer: Self-pay | Admitting: Medical

## 2010-12-06 LAB — COMPREHENSIVE METABOLIC PANEL
ALT: 20
AST: 21
Albumin: 2.9 — ABNORMAL LOW
Albumin: 2.9 — ABNORMAL LOW
Alkaline Phosphatase: 57
BUN: 10
CO2: 25
Calcium: 8.7
Chloride: 97
Creatinine, Ser: 0.98
Creatinine, Ser: 0.98
GFR calc Af Amer: >60
GFR calc non Af Amer: >60
Glucose, Bld: 179 — ABNORMAL HIGH
Potassium: 3.9
Potassium: 3.9
Sodium: 130 — ABNORMAL LOW
Total Bilirubin: 0.5
Total Protein: 6

## 2010-12-06 LAB — CBC
Hemoglobin: 12.9 — ABNORMAL LOW
Hemoglobin: 13.4
MCHC: 34.3
MCHC: 34.4
MCV: 83.8
Platelets: 277
Platelets: 282
RBC: 4.49
RDW: 17.1 — ABNORMAL HIGH
RDW: 17.1 — ABNORMAL HIGH
WBC: 10.8 — ABNORMAL HIGH
WBC: 12.4 — ABNORMAL HIGH

## 2010-12-06 LAB — LIPID PANEL
Cholesterol: 108
Total CHOL/HDL Ratio: 4.7
VLDL: 32

## 2010-12-06 LAB — BASIC METABOLIC PANEL
CO2: 28
Chloride: 96
GFR calc Af Amer: >60
Glucose, Bld: 257 — ABNORMAL HIGH
Potassium: 4
Sodium: 126 — ABNORMAL LOW
Sodium: 130 — ABNORMAL LOW

## 2010-12-06 LAB — URINALYSIS, MICROSCOPIC ONLY
Glucose, UA: 250 — AB
Leukocytes, UA: NEGATIVE
Protein, ur: NEGATIVE
Specific Gravity, Urine: 1.011
pH: 6

## 2010-12-06 LAB — PROTIME-INR
INR: 1
Prothrombin Time: 13.8

## 2010-12-06 LAB — CK TOTAL AND CKMB (NOT AT ARMC)
CK, MB: 5 — ABNORMAL HIGH
Relative Index: 3.1 — ABNORMAL HIGH

## 2010-12-06 LAB — AMMONIA: Ammonia: 21

## 2010-12-06 LAB — ETHANOL: Alcohol, Ethyl (B): 89 — ABNORMAL HIGH

## 2010-12-06 LAB — CALCIUM: Calcium: 8.6

## 2010-12-06 LAB — OSMOLALITY: Osmolality: 274 — ABNORMAL LOW

## 2010-12-06 LAB — TSH: TSH: 4.725

## 2010-12-06 LAB — APTT: aPTT: 33

## 2010-12-06 LAB — HEMOGLOBIN A1C: Hgb A1c MFr Bld: 9.4 — ABNORMAL HIGH

## 2011-04-30 ENCOUNTER — Emergency Department (HOSPITAL_COMMUNITY)
Admission: EM | Admit: 2011-04-30 | Discharge: 2011-04-30 | Disposition: A | Discharge status: Home / Self Care | Payer: Medicare Other | Attending: Emergency Medicine | Admitting: Emergency Medicine

## 2011-04-30 ENCOUNTER — Encounter (HOSPITAL_COMMUNITY): Payer: Self-pay | Admitting: *Deleted

## 2011-04-30 DIAGNOSIS — R030 Elevated blood-pressure reading, without diagnosis of hypertension: Secondary | ICD-10-CM | POA: Insufficient documentation

## 2011-04-30 DIAGNOSIS — R7309 Other abnormal glucose: Secondary | ICD-10-CM | POA: Insufficient documentation

## 2011-04-30 HISTORY — DX: Essential (primary) hypertension: I10

## 2011-04-30 NOTE — ED Notes (Signed)
Per EMS- pt in from doctors office for wound check, states while there his BP was noted to be elevated and his CBG was elevated. Pt has been off medication for a few months, pt denies complaints but MD office wanted him evaluated

## 2011-10-23 ENCOUNTER — Telehealth: Payer: Self-pay

## 2011-10-23 NOTE — Telephone Encounter (Signed)
Error

## 2014-10-27 DEATH — deceased

## 2015-06-23 ENCOUNTER — Other Ambulatory Visit: Payer: Self-pay

## 2015-06-23 MED ORDER — ALPRAZOLAM 0.5 MG PO TABS: 0.5000 mg | ORAL_TABLET | Freq: Two times a day (BID) | ORAL | Status: AC | PRN

## 2015-06-23 NOTE — Telephone Encounter (Signed)
Rx sent to [facility] phone : [phone] , fax : [phone]
